# Patient Record
Sex: Female | Born: 2000 | Race: White | Hispanic: No | Marital: Single | Smoking: Never smoker
Health system: Southern US, Community
[De-identification: ages and names within clinical notes are randomized; demographics above are authoritative.]

## PROBLEM LIST (undated history)

## (undated) HISTORY — PX: NO PAST SURGERIES: SHX2092

---
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
## 2020-12-13 ENCOUNTER — Other Ambulatory Visit: Payer: Self-pay

## 2020-12-13 ENCOUNTER — Ambulatory Visit
Admission: EM | Admit: 2020-12-13 | Discharge: 2020-12-13 | Disposition: A | Discharge status: Home / Self Care | Payer: Self-pay | Attending: Emergency Medicine | Admitting: Emergency Medicine

## 2020-12-13 DIAGNOSIS — S161XXA Strain of muscle, fascia and tendon at neck level, initial encounter: Secondary | ICD-10-CM

## 2020-12-13 DIAGNOSIS — S29019A Strain of muscle and tendon of unspecified wall of thorax, initial encounter: Secondary | ICD-10-CM

## 2020-12-13 MED ORDER — IBUPROFEN 600 MG PO TABS: 600.0000 mg | ORAL_TABLET | Freq: Four times a day (QID) | ORAL | 0 refills | Status: DC | PRN

## 2020-12-13 MED ORDER — TIZANIDINE HCL 4 MG PO TABS: 4.0000 mg | ORAL_TABLET | Freq: Four times a day (QID) | ORAL | 0 refills | Status: DC | PRN

## 2020-12-13 NOTE — Discharge Instructions (Addendum)
Take the ibuprofen, 600 mg every 6 hours with food, on a schedule for the next 2 days to help with muscle inflammation.  Take the tizanidine, 4 mg every 6 hours, on a schedule for the next 2 days for muscle spasm.  After 2 days have elapsed you can use both medications on a as needed basis.  No lifting more than 5 pounds.  Perform range of motion exercises to help flush the lactic acid from the muscles.  The more you lay around the tighter muscles become in the more the pain increases.  Increase your oral water intake to help improve hydration and help facilitate the body clearing lactic acid.  Moist heat for 20 minutes at a time, 2-3 times a day, will help improve blood flow and aid in healing.  If your symptoms continue, or worsen, return for reevaluation or see your primary care provider.
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
## 2023-07-29 ENCOUNTER — Ambulatory Visit (INDEPENDENT_AMBULATORY_CARE_PROVIDER_SITE_OTHER): Payer: BC Managed Care – PPO

## 2023-07-29 ENCOUNTER — Ambulatory Visit
Admission: EM | Admit: 2023-07-29 | Discharge: 2023-07-29 | Disposition: A | Discharge status: Home / Self Care | Payer: BC Managed Care – PPO | Attending: Emergency Medicine | Admitting: Emergency Medicine

## 2023-07-29 DIAGNOSIS — R202 Paresthesia of skin: Secondary | ICD-10-CM | POA: Diagnosis not present

## 2023-07-29 DIAGNOSIS — R519 Headache, unspecified: Secondary | ICD-10-CM | POA: Diagnosis not present

## 2023-07-29 DIAGNOSIS — Z20822 Contact with and (suspected) exposure to covid-19: Secondary | ICD-10-CM | POA: Insufficient documentation

## 2023-07-29 DIAGNOSIS — R0789 Other chest pain: Secondary | ICD-10-CM | POA: Insufficient documentation

## 2023-07-29 LAB — SARS CORONAVIRUS 2 BY RT PCR: SARS Coronavirus 2 by RT PCR: NEGATIVE

## 2023-07-29 MED ORDER — AEROCHAMBER MV MISC: 1 refills | Status: DC

## 2023-07-29 MED ORDER — NAPROXEN 500 MG PO TABS: 500.0000 mg | ORAL_TABLET | Freq: Two times a day (BID) | ORAL | 0 refills | Status: DC

## 2023-07-29 MED ORDER — ALBUTEROL SULFATE HFA 108 (90 BASE) MCG/ACT IN AERS
1.0000 | INHALATION_SPRAY | RESPIRATORY_TRACT | 0 refills | Status: AC | PRN
Start: 2023-07-29 — End: ?

## 2023-07-29 NOTE — ED Triage Notes (Signed)
Sx started yesterday. Woke up with a headache. Having dizziness-chest/back  tightness. "Tingling" in left side of chest under clavicle. Hasn't taken anything today. Took tylenol and advil yesterday that helped headache.

## 2023-07-29 NOTE — ED Provider Notes (Signed)
HPI  SUBJECTIVE:  Michele Guerrero is a 22 y.o. female who reports left-sided chest pain described as tingling, lasting minutes, with occasional radiation to her left fingers starting yesterday.  She reports palpitations, sensation of chest tightness and soreness across her chest.  She reports shortness of breath with lying down only.  No accompanying nausea, diaphoresis, radiation of this pain up her neck, or through to her back.  No coughing, wheezing.  No change in physical activity, trauma to the chest, rash, abdominal pain, syncope, GERD symptoms.  There is no exertional component to it.  No calf pain, swelling, hemoptysis, surgery in the past 4 weeks, recent immobilization, exogenous estrogen/OCP use.  She has tried Advil and Tylenol.  Symptoms are better with rest.  No aggravating factors.  It is not associated with torso rotation, or movement.  She also reports dull, constant midthoracic back pain starting yesterday.  There are no aggravating or alleviating factors for this.  She also reports a gradual onset headache located along her temples and the back of her head in a bandlike distribution.  It is worse on the left.  Reports some mild photophobia.  No visual changes, neck stiffness, rash, slurred speech, facial numbness, tingling, weakness, arm or leg weakness.  No ear pain, nasal congestion.  She does not grind her teeth at night.  She has tried Advil and Tylenol with improvement in her headache.  She has a past medical history of GERD, states that it is not bothering her now, exercise-induced asthma, and a BMI above 30.  No history of PE, DVT, smoking, MI, coronary disease, diabetes, hypertension, hypercholesterolemia, CVA, PAD/PVD.  Family history negative for MI.  No known COVID exposure.   History reviewed. No pertinent past medical history.  Past Surgical History:  Procedure Laterality Date   NO PAST SURGERIES      Family History  Problem Relation Age of Onset   Healthy Mother     Healthy Father     Social History   Tobacco Use   Smoking status: Never   Smokeless tobacco: Never  Vaping Use   Vaping status: Never Used  Substance Use Topics   Alcohol use: Never   Drug use: Never    No current facility-administered medications for this encounter.  Current Outpatient Medications:    albuterol (VENTOLIN HFA) 108 (90 Base) MCG/ACT inhaler, Inhale 1-2 puffs into the lungs every 4 (four) hours as needed for wheezing or shortness of breath., Disp: 1 each, Rfl: 0   naproxen (NAPROSYN) 500 MG tablet, Take 1 tablet (500 mg total) by mouth 2 (two) times daily., Disp: 20 tablet, Rfl: 0   Spacer/Aero-Holding Chambers (AEROCHAMBER MV) inhaler, Use as instructed, Disp: 1 each, Rfl: 1  No Known Allergies   ROS  As noted in HPI.   Physical Exam  BP 123/80 (BP Location: Left Arm)   Pulse 73   Temp 98.2 F (36.8 C) (Oral)   Resp 17   Wt 102.1 kg   LMP 07/24/2023 (Approximate)   SpO2 100%   BMI 37.44 kg/m   Constitutional: Well developed, well nourished, no acute distress Eyes: PERRL, EOMI, conjunctiva normal bilaterally.  HENT: Normocephalic, atraumatic,mucus membranes moist, normal dentition.  TM normal b/l.  Left TMJ tenderness. No nasal congestion, no sinus tenderness. No temporal artery tenderness.  Neck: no cervical LN, no trapezial muscle tenderness. No meningismus Respiratory: normal inspiratory effort, lungs clear bilaterally.  No reproducible chest wall tenderness Cardiovascular: Normal rate, regular rhythm GI:  nondistended skin: No  rash, skin intact Musculoskeletal: Calves symmetric, nontender.  No edema Neurologic: Alert & oriented x 3, CN III-XII intact,  tandem gait steady Psychiatric: Speech and behavior appropriate   ED Course   Medications - No data to display  Orders Placed This Encounter  Procedures   SARS Coronavirus 2 by RT PCR (hospital order, performed in [facility] hospital lab) *cepheid single result test* Anterior Nasal Swab     Standing Status:   Standing    Number of Occurrences:   1   DG Chest 2 View    Standing Status:   Standing    Number of Occurrences:   1    Order Specific Question:   Reason for Exam (SYMPTOM  OR DIAGNOSIS REQUIRED)    Answer:   left sided CP r/o acute cardiopulm dz   ED EKG    Standing Status:   Standing    Number of Occurrences:   1    Order Specific Question:   Reason for Exam    Answer:   Chest Pain   EKG 12-Lead    Standing Status:   Standing    Number of Occurrences:   1   Results for orders placed or performed during the hospital encounter of 07/29/23 (from the past 24 hour(s))  SARS Coronavirus 2 by RT PCR (hospital order, performed in [facility] hospital lab) *cepheid single result test* Anterior Nasal Swab     Status: None   Collection Time: 07/29/23 12:18 PM   Specimen: Anterior Nasal Swab  Result Value Ref Range   SARS Coronavirus 2 by RT PCR NEGATIVE NEGATIVE   DG Chest 2 View  Result Date: 07/29/2023 CLINICAL DATA:  Chest pain. EXAM: CHEST - 2 VIEW COMPARISON:  None Available. FINDINGS: The heart size and mediastinal contours are within normal limits. Both lungs are clear. The visualized skeletal structures are unremarkable. IMPRESSION: No active cardiopulmonary disease. Electronically Signed   By: Lupita Raider M.D.   On: 07/29/2023 12:39     ED Clinical Impression  1. Paresthesias   2. Acute nonintractable headache, unspecified headache type   3. Encounter for laboratory testing for COVID-19 virus   4. Feeling of chest tightness     ED Assessment/Plan      1.  Chest pain/tingling.  Patient is PERC negative.  Will check EKG and chest x-ray.  EKG: Sinus bradycardia, rate 55.  Normal axis, normal intervals.  No hypertrophy.  No ST-T wave changes.  No previous EKG for comparison.  Patient asymptomatic while EKG was obtained.  Imaging independently reviewed.  No acute cardiopulmonary disease.  See radiology report for details.  HEART score:   History:  Moderately suspicious +1 EKG: Normal 0 Age: Below 45 0 Risk factors: 1 risk factor- BMI above 30+1 Troponin: Not available  HEARt score 2.  She is at low risk for 30-day MACE.  Discussed this with parent and patient.  2.  Bandlike headache.  Suspect tension headache versus musculoskeletal headache.  Checking COVID. Doubt SAH, ICH or space occupying lesion. Pt without fevers/chills, Pt has no meningeal sx, no nuchal rigidity. Doubt meningitis. Pt with normal neuro exam, no evidence of CVA/TIA.  Pt BP not elevated significantly, doubt hypertensive emergency. No evidence of temporal artery tenderness, no evidence of glaucoma or other ocular pathology.   COVID-negative.  Uncertain as to the etiology of patient's symptoms, without life-threatening emergency such as dissection, PE, ACS.  EKG, chest x-ray reassuring.  Will send home with Naprosyn/Tylenol, and try an  albuterol inhaler with spacer given her history of asthma.  She will follow-up with her PCP as needed.  Strict ER return precautions given..  Discussed EKG, imaging, MDM, plan for follow up, signs and sx that should prompt return to ER. Pt and parent agree with plan  Meds ordered this encounter  Medications   naproxen (NAPROSYN) 500 MG tablet    Sig: Take 1 tablet (500 mg total) by mouth 2 (two) times daily.    Dispense:  20 tablet    Refill:  0   albuterol (VENTOLIN HFA) 108 (90 Base) MCG/ACT inhaler    Sig: Inhale 1-2 puffs into the lungs every 4 (four) hours as needed for wheezing or shortness of breath.    Dispense:  1 each    Refill:  0   Spacer/Aero-Holding Chambers (AEROCHAMBER MV) inhaler    Sig: Use as instructed    Dispense:  1 each    Refill:  1    *This clinic note was created using Dragon dictation software. Therefore, there may be occasional mistakes despite careful proofreading.  ?    Domenick Gong, MD 07/29/23 1423

## 2023-07-29 NOTE — Discharge Instructions (Signed)
Your EKG and chest x-ray were reassuring.  Take the Naprosyn with 1000 mg of Tylenol twice a day.  Push electrolyte containing fluids such as Pedialyte or Gatorade.  2 puffs from your albuterol inhaler every 4-6 hours using a spacer.  Go to the ER if you get worse or for any concerns

## 2024-11-05 ENCOUNTER — Encounter: Payer: Self-pay | Admitting: Emergency Medicine

## 2024-11-05 ENCOUNTER — Ambulatory Visit
Admission: EM | Admit: 2024-11-05 | Discharge: 2024-11-05 | Disposition: A | Discharge status: Home / Self Care | Attending: Emergency Medicine | Admitting: Emergency Medicine

## 2024-11-05 DIAGNOSIS — Z113 Encounter for screening for infections with a predominantly sexual mode of transmission: Secondary | ICD-10-CM | POA: Diagnosis present

## 2024-11-05 LAB — HIV ANTIBODY (ROUTINE TESTING W REFLEX): HIV Screen 4th Generation wRfx: NONREACTIVE

## 2024-11-05 NOTE — Discharge Instructions (Addendum)
 As we discussed, your samples will go to the lab and the results should be back within the next 1 to 3 days.  If any of your tests are positive you will be contacted by phone and treatment options will be provided.  If your results are negative they will appear in your MyChart.
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
## 2024-11-08 LAB — CERVICOVAGINAL ANCILLARY ONLY
Chlamydia: NEGATIVE
Comment: NEGATIVE
Comment: NEGATIVE
Comment: NORMAL
Neisseria Gonorrhea: NEGATIVE
Trichomonas: NEGATIVE
